# Patient Record
Sex: Male | Born: 1965 | Hispanic: Yes | Marital: Married | Smoking: Never smoker
Health system: Southern US, Community
[De-identification: ages and names within clinical notes are randomized; demographics above are authoritative.]

---

## 2012-05-01 ENCOUNTER — Ambulatory Visit: Payer: Self-pay | Admitting: Family Medicine

## 2012-05-01 VITALS — BP 106/70 | HR 63 | Temp 97.9°F | Resp 16 | Ht 64.5 in | Wt 182.0 lb

## 2012-05-01 DIAGNOSIS — IMO0002 Reserved for concepts with insufficient information to code with codable children: Secondary | ICD-10-CM

## 2012-05-01 DIAGNOSIS — M5416 Radiculopathy, lumbar region: Secondary | ICD-10-CM

## 2012-05-01 DIAGNOSIS — M545 Low back pain, unspecified: Secondary | ICD-10-CM

## 2012-05-01 MED ORDER — OXAPROZIN 600 MG PO TABS: ORAL_TABLET | ORAL | Status: DC

## 2012-05-01 MED ORDER — PREDNISONE 20 MG PO TABS: ORAL_TABLET | ORAL | Status: AC

## 2012-05-01 MED ORDER — CYCLOBENZAPRINE HCL 10 MG PO TABS: ORAL_TABLET | ORAL | Status: DC

## 2012-05-01 NOTE — Patient Instructions (Signed)
Back instruction book  Return in 2 weeks if not better

## 2012-05-01 NOTE — Progress Notes (Signed)
Subjective: 46 year old man who has had an accident and some back pain years ago. 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 He is continued to hurt. It is painful when he lifts things. It is painful when he goes up and down steps. He has not seen a physician for this. He has continued to work. Otherwise he is healthy and not on any regular meds.  Objective: Hispanic male in no acute distress. Alert and oriented. 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 His abdomen is soft. Straight leg raising test is negative. He has lumbar spine looks normal. He's got tenderness over the lower lumbar region, as well as in the right SI joint area. The pain goes down the leg a little bit when he stands. His gait is good. His deep tendon reflexes brisk in the right knee, absent in the left.  Assessment: Low back pain with right radiculopathy  Plan: Prednisone Daypro Try to rest of the best he can Flexeril bedtime

## 2012-05-15 ENCOUNTER — Ambulatory Visit: Payer: Self-pay | Admitting: Family Medicine

## 2012-05-15 ENCOUNTER — Ambulatory Visit: Payer: Self-pay

## 2012-05-15 VITALS — BP 126/78 | HR 94 | Temp 98.0°F | Resp 18 | Ht 64.5 in | Wt 179.4 lb

## 2012-05-15 DIAGNOSIS — M545 Low back pain, unspecified: Secondary | ICD-10-CM

## 2012-05-15 DIAGNOSIS — M533 Sacrococcygeal disorders, not elsewhere classified: Secondary | ICD-10-CM

## 2012-05-15 DIAGNOSIS — IMO0002 Reserved for concepts with insufficient information to code with codable children: Secondary | ICD-10-CM

## 2012-05-15 DIAGNOSIS — M5416 Radiculopathy, lumbar region: Secondary | ICD-10-CM

## 2012-05-15 DIAGNOSIS — K5900 Constipation, unspecified: Secondary | ICD-10-CM

## 2012-05-15 MED ORDER — DICLOFENAC SODIUM 75 MG PO TBEC: 75.0000 mg | DELAYED_RELEASE_TABLET | Freq: Two times a day (BID) | ORAL | Status: AC

## 2012-05-15 MED ORDER — CYCLOBENZAPRINE HCL 10 MG PO TABS: ORAL_TABLET | ORAL | Status: DC

## 2012-05-15 NOTE — Progress Notes (Signed)
Subjective: Patient continues to have pain in his low back, especially on the right side at the SI joint region. He has refrained from working because the pain is intense enough. Says that if the pain was previously ATN, it is now down to about a 6. Even when he is laying down on his stomach will hurt. The pain pills help at bedtime.  Objective: Abdomen soft. Straight leg raising negative. Tenderness in the lower lumbar spine and right sacroiliac region. There does not seem to be a lot of radiculopathy today. Moderate pain on anterior flexion and along the left lateral flexion. Flexing to the right does not seem to bother him. There is some pain on extension. Gait is normal.  Assessment: Low back pain and right sacroiliac pain  Plan: X-ray lumbar spine and proceed from there  [facility] reading (PRIMARY) by  Dr. Alwyn Ren Normal disc spaces and SI joints on plain films.  Lots of bowel contents.  .  Will place him on voltaren twice a day and continue Flexeril at bedtime. Bowels, to loosen his stools and clean him out to help take some of the pressure off the back. Return if worse, otherwise if he is not doing a lot better in a month he is to come back. some MiraLax for

## 2012-05-15 NOTE — Patient Instructions (Addendum)
Get some over-the-counter MiraLax and take one dose daily until stools are loose, then decrease and just take half a dose if necessary to keep bowels moving well.  Take the medications as directed. Return in 1 month if not doing much better.  If you do return to working, please try to be careful in how you lift and pull with your back.

## 2012-05-30 ENCOUNTER — Ambulatory Visit: Payer: Self-pay | Admitting: Family Medicine

## 2012-05-30 VITALS — BP 116/78 | HR 56 | Temp 98.3°F | Resp 14 | Ht 64.5 in | Wt 180.0 lb

## 2012-05-30 DIAGNOSIS — IMO0002 Reserved for concepts with insufficient information to code with codable children: Secondary | ICD-10-CM

## 2012-05-30 DIAGNOSIS — S39012A Strain of muscle, fascia and tendon of lower back, initial encounter: Secondary | ICD-10-CM

## 2012-05-30 MED ORDER — PREDNISONE 20 MG PO TABS: ORAL_TABLET | ORAL | Status: AC

## 2012-05-30 NOTE — Progress Notes (Signed)
[facility] [address], [place] [address] [phone]  Date:  05/30/2012   Name:  Timothy Reese   DOB:  02-Jun-1966   MRN:  [id number]  PCP:  No primary provider on file.    Chief Complaint: Back Pain   History of Present Illness:  Timothy Reese is a 46 y.o. very pleasant male patient who presents with the following:  History of an accident and back injury a few years ago.  He re-injured his back earlier this summer when he lifted something.  He has been treated with prednisone, muscle relaxer and anti- inflammatories.  He took the course of prednisone about one month ago- it did seem to help.  At his follow- up visit he was treated with voltaren and flexeril   Negative x-rays done in August LUMBAR SPINE - 2-3 VIEW  Comparison: None.  Findings: Five lumbar-type vertebral bodies.  Normal lumbar lordosis.  No evidence of fracture or dislocation. Vertebral body heights and  intervertebral disc spaces are maintained.  Visualized bony pelvis appears intact.  Mild degenerative changes of the left hip.  The bilateral sacroiliac joints appear unremarkable.  IMPRESSION:  No acute osseous abnormality is seen.  Clinically significant discrepancy from primary report, if  provided: None  He complains again of back pain just like he had at the beginning of his treatment here.  A week ago he noted numbness in his left leg again- this can come and go.  No weakness in the leg.  He does not have any saddle anesthesia/ groin numbness, and no incont of bowel or bladder.  The numbness/ tingling is in the left lateral thigh area.     Patient Active Problem List  Diagnosis  . Low back pain    No past medical history on file.  No past surgical history on file.  History  Substance Use Topics  . Smoking status: Never Smoker   . Smokeless tobacco: Not on file  . Alcohol Use: Not on file    No family history on file.  No Known Allergies  Medication list has  been reviewed and updated.  Current Outpatient Prescriptions on File Prior to Visit  Medication Sig Dispense Refill  . cyclobenzaprine (FLEXERIL) 10 MG tablet Take one at bedtime for muscle relaxant  30 tablet  1  . diclofenac (VOLTAREN) 75 MG EC tablet Take 1 tablet (75 mg total) by mouth 2 (two) times daily.  60 tablet  1    Review of Systems:  As per HPI- otherwise negative.   Physical Examination: Filed Vitals:   05/30/12 1028  BP: 116/78  Pulse: 56  Temp: 98.3 F (36.8 C)  Resp: 14   Filed Vitals:   05/30/12 1028  Height: 5' 4.5" (1.638 m)  Weight: 180 lb (81.647 kg)   Body mass index is 30.42 kg/(m^2). Ideal Body Weight: Weight in (lb) to have BMI = 25: 147.6   GEN: WDWN, NAD, Non-toxic, A & O x 3 HEENT: Atraumatic, Normocephalic. Neck supple. No masses, No LAD.  PEERL, EOMI, oropharynx wnl Ears and Nose: No external deformity. CV: RRR, No M/G/R. No JVD. No thrill. No extra heart sounds. PULM: CTA B, no wheezes, crackles, rhonchi. No retractions. No resp. distress. No accessory muscle use. EXTR: No c/c/e NEURO Normal gait.  PSYCH: Normally interactive. Conversant. Not depressed or anxious appearing.  Calm demeanor.  Back: he has tenderness over the right SI joint, but some positive LEFT straight leg raise.  Normal strength,  normal patellar DTR.  No sensory deficit  [name] does not speak [place], but has brought along a professional interpreter today.   Assessment and Plan: 1. Back strain  predniSONE (DELTASONE) 20 MG tablet   Suspect that [facility] may have a disc bulge in his lower back.  Explained that this problem may self- resolve, but at some point if his symptoms continue we need to think about an MRI.   Will try another course of prednisone for now, and gave him a letter outlining light duty for his job.  He will let me know if his symptoms persist after one or two weeks more please call me or RTC- sooner if he has any worsening or change of his symptoms   Meds  ordered this encounter  Medications  . predniSONE (DELTASONE) 20 MG tablet    Sig: Take 2 pills for 4 days, than 1 pill for 4 days    Dispense:  12 tablet    Refill:  0     [name], MD

## 2013-05-31 IMAGING — CR DG LUMBAR SPINE 2-3V
2 series · 2 of 2 positions shown · non-contrast
Comparison: None.

CLINICAL DATA: Right lower back pain

LUMBAR SPINE - 2-3 VIEW

[AP]
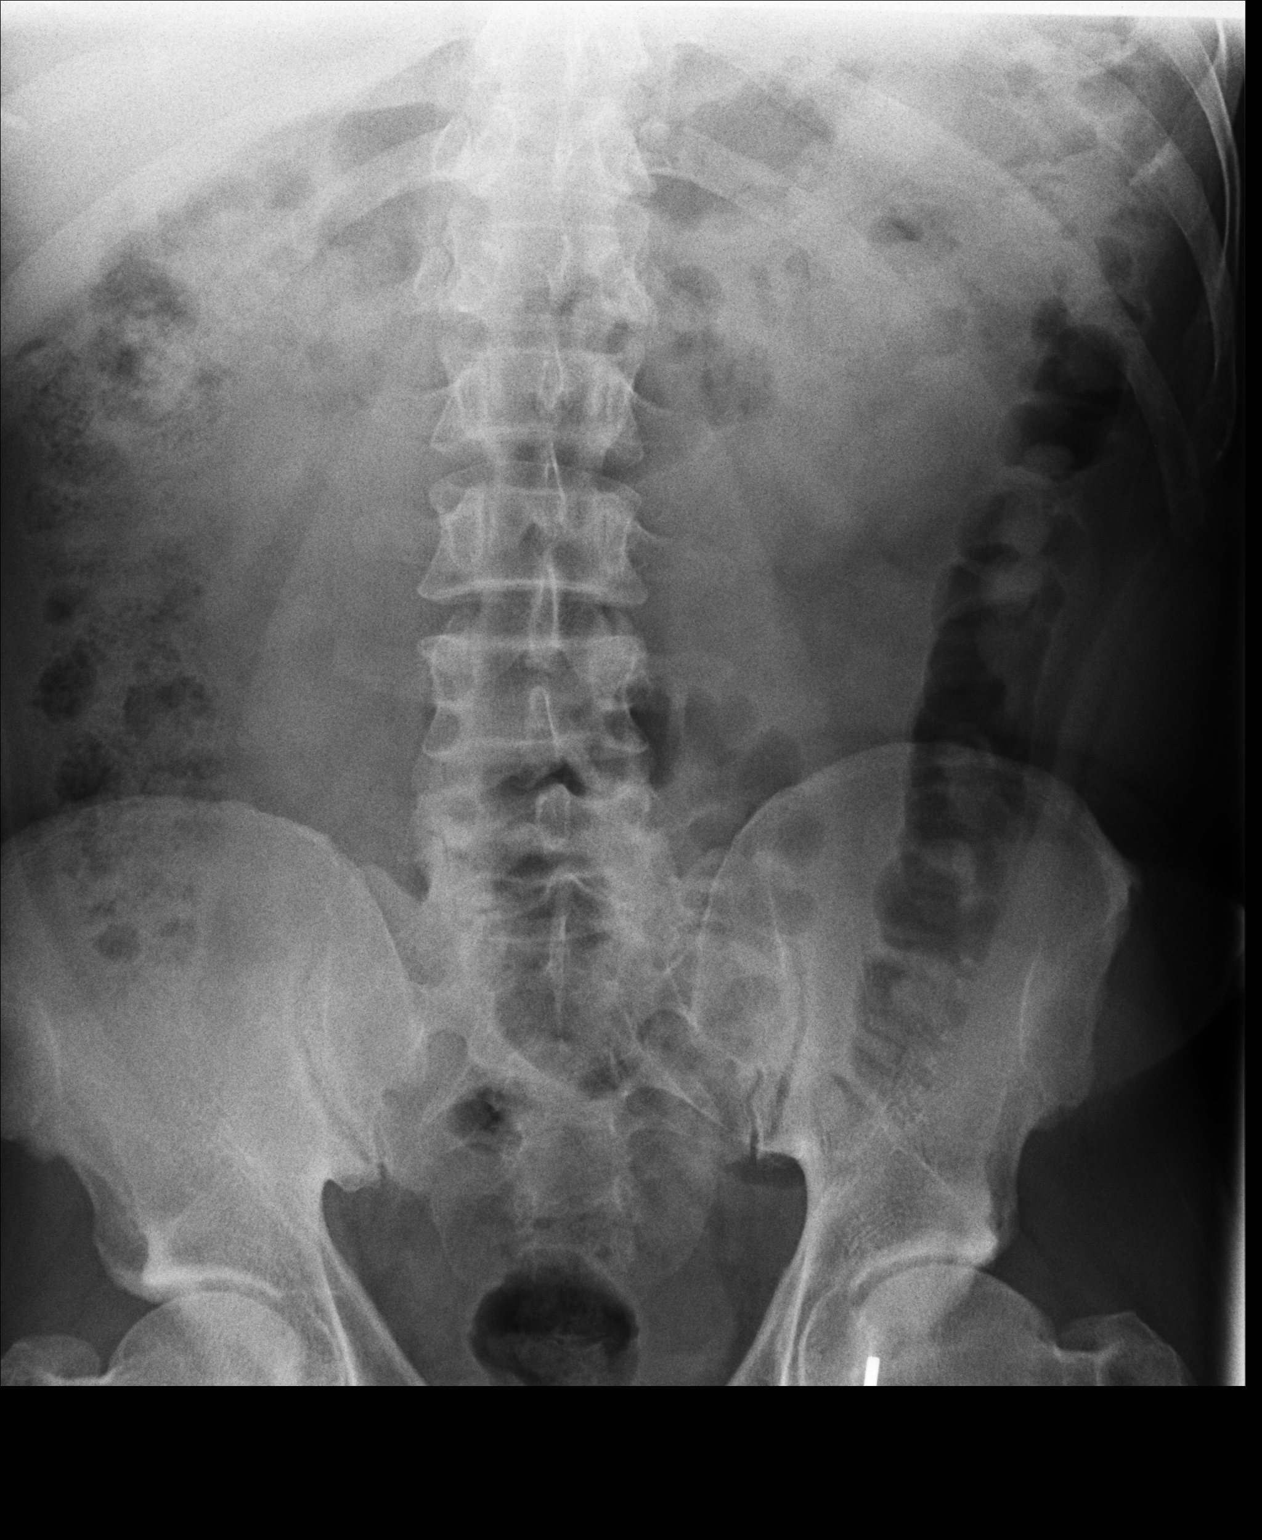

[lateral]
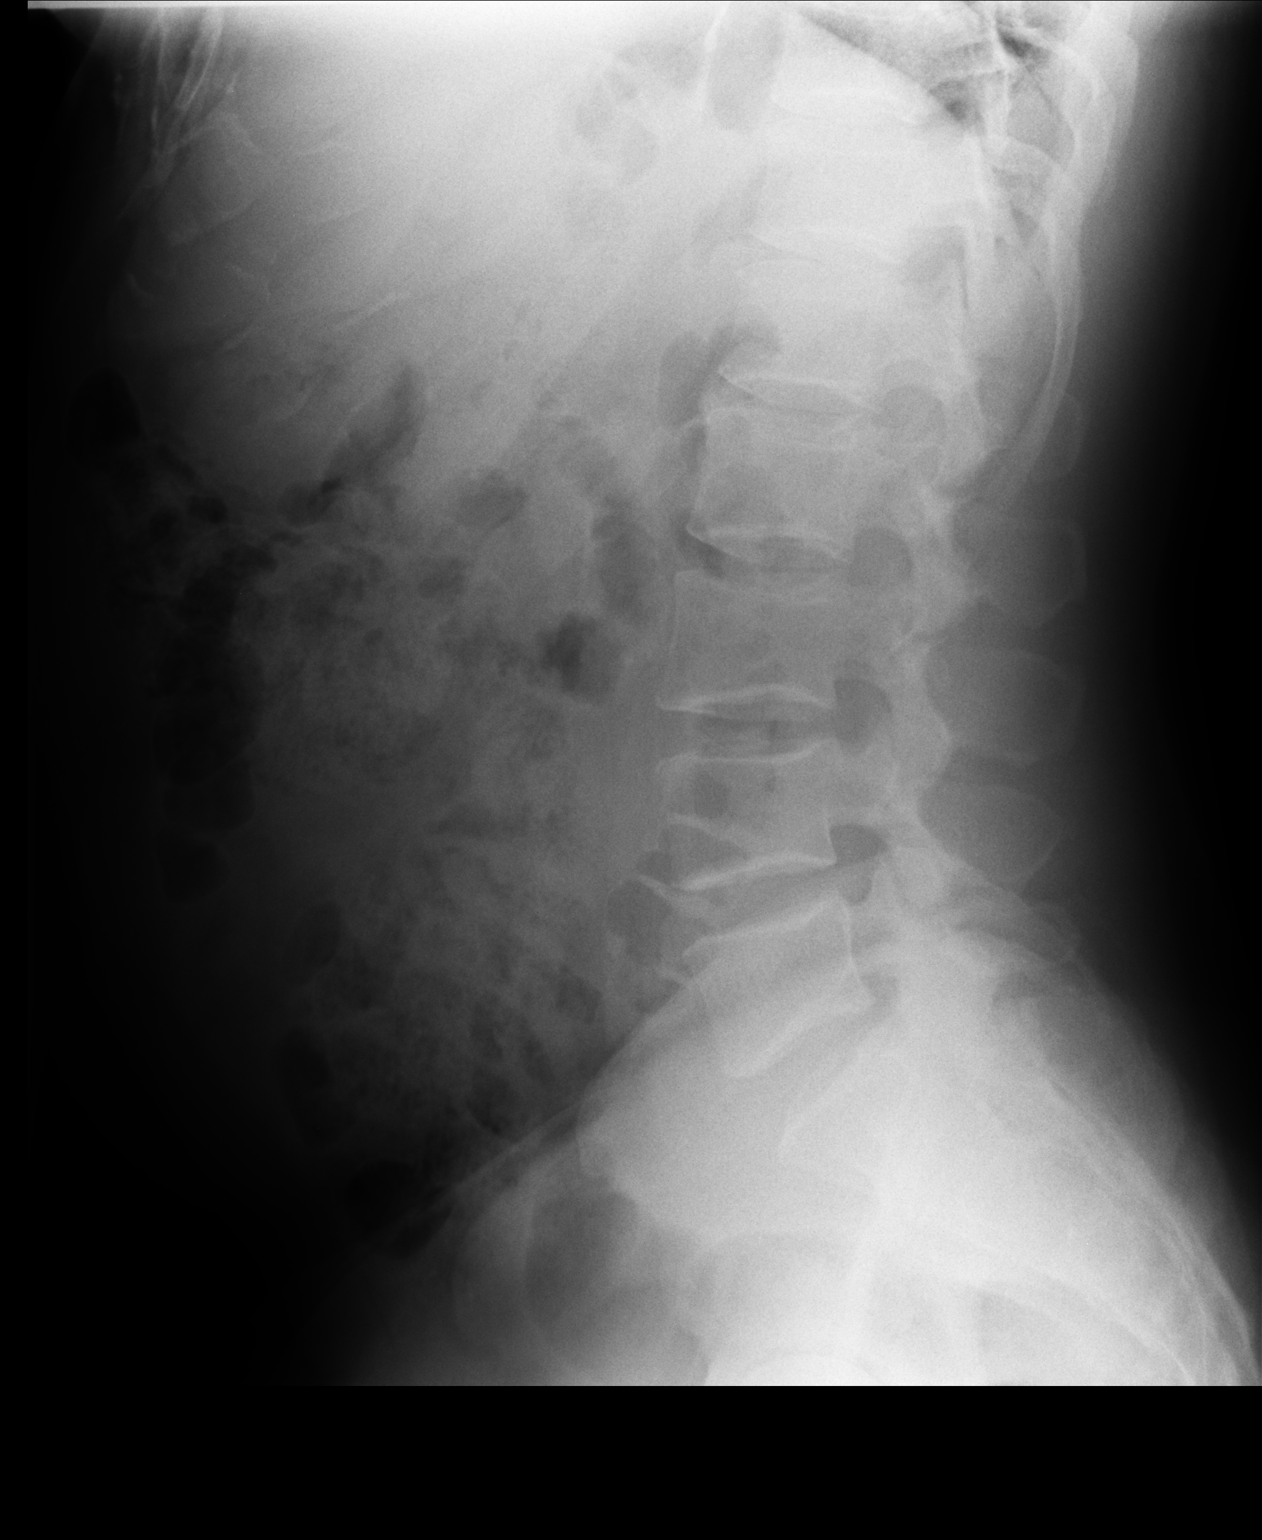

[2 of 2 positions shown; findings below may reference images not displayed]

FINDINGS: Five lumbar-type vertebral bodies.

Normal lumbar lordosis.

No evidence of fracture or dislocation.  Vertebral body heights and
intervertebral disc spaces are maintained.

Visualized bony pelvis appears intact.

Mild degenerative changes of the left hip.

The bilateral sacroiliac joints appear unremarkable.
IMPRESSION: No acute osseous abnormality is seen.

Clinically significant discrepancy from primary report, if
provided: None

## 2015-02-26 ENCOUNTER — Ambulatory Visit (INDEPENDENT_AMBULATORY_CARE_PROVIDER_SITE_OTHER): Payer: Self-pay

## 2015-02-26 ENCOUNTER — Ambulatory Visit (INDEPENDENT_AMBULATORY_CARE_PROVIDER_SITE_OTHER): Payer: Self-pay | Admitting: Family Medicine

## 2015-02-26 VITALS — BP 106/64 | HR 65 | Temp 98.5°F | Resp 18 | Ht 65.0 in | Wt 181.0 lb

## 2015-02-26 DIAGNOSIS — T148XXA Other injury of unspecified body region, initial encounter: Secondary | ICD-10-CM

## 2015-02-26 DIAGNOSIS — M25552 Pain in left hip: Secondary | ICD-10-CM

## 2015-02-26 DIAGNOSIS — T148 Other injury of unspecified body region: Secondary | ICD-10-CM

## 2015-02-26 MED ORDER — CYCLOBENZAPRINE HCL 10 MG PO TABS: ORAL_TABLET | ORAL | Status: DC

## 2015-02-26 MED ORDER — NAPROXEN 500 MG PO TABS: 500.0000 mg | ORAL_TABLET | Freq: Two times a day (BID) | ORAL | Status: DC

## 2015-02-26 NOTE — Progress Notes (Signed)
Chief Complaint:  Chief Complaint  Patient presents with  . Leg Pain    left x2 mths     HPI: Timothy Reese is a 49 y.o. male who is here for  2 month history of left groin and upper thigh pain. He works in [employer]. He denies any injury. He has had this for 2 months and there's been intermittent pain that is localized. He has pain specifically with prolonged walking and also climbing up stairs or ladders. He denies in inguinal hernia. He denies any numbness weakness or issues with his urinary flow or incontinence. No fevers or chills.  History reviewed. No pertinent past medical history. History reviewed. No pertinent past surgical history. History   Social History  . Marital Status: Married    Spouse Name: N/A  . Number of Children: N/A  . Years of Education: N/A   Social History Main Topics  . Smoking status: Never Smoker   . Smokeless tobacco: Not on file  . Alcohol Use: Not on file  . Drug Use: Not on file  . Sexual Activity: Not on file   Other Topics Concern  . None   Social History Narrative   History reviewed. No pertinent family history. No Known Allergies Prior to Admission medications   Medication Sig Start Date End Date Taking? Authorizing Provider  cyclobenzaprine (FLEXERIL) 10 MG tablet Take one at bedtime for muscle relaxant Patient not taking: Reported on 02/26/2015 05/15/12   Peyton Najjaravid H Hopper, MD     ROS: The patient denies fevers, chills, night sweats, unintentional weight loss, chest pain, palpitations, wheezing, dyspnea on exertion, nausea, vomiting, abdominal pain, dysuria, hematuria, melena, numbness, weakness, or tingling.  All other systems have been reviewed and were otherwise negative with the exception of those mentioned in the HPI and as above.    PHYSICAL EXAM: Filed Vitals:   02/26/15 1240  BP: 106/64  Pulse: 65  Temp: 98.5 F (36.9 C)  Resp: 18   Filed Vitals:   02/26/15 1240  Height: 5\' 5"  (1.651 m)  Weight: 181 lb  (82.101 kg)   Body mass index is 30.12 kg/(m^2).   General: Alert, no acute distress HEENT:  Normocephalic, atraumatic, oropharynx patent. EOMI, PERRLA Cardiovascular:  Regular rate and rhythm, no rubs murmurs or gallops.  No Carotid bruits, radial pulse intact. No pedal edema.  Respiratory: Clear to auscultation bilaterally.  No wheezes, rales, or rhonchi.  No cyanosis, no use of accessory musculature GI: No organomegaly, abdomen is soft and non-tender, positive bowel sounds.  No masses. Skin: No rashes. Neurologic: Facial musculature symmetric. Psychiatric: Patient is appropriate throughout our interaction. Lymphatic: No cervical lymphadenopathy Musculoskeletal: Gait intact. Back  - paramsk tenderness  Full ROM 5/5 strength, 2/2 DTRs No saddle anesthesia Straight leg negative  Left  Hip normal exal except pain with flexion and external rotation, 5/5 strength, nl sensationm grind test neg Nontender at great troch  No deformity, Neg ballotment Diffuse tenderness Decrease AROM, full PROM 5/5 strength, 2/2 DTRs ankle ,  Neg Lachman, Neg medial jt line tenderness, neg McMurray L spine normal ROM,  Straight leg negative Hip normal ROM    LABS: No results found for this or any previous visit.   EKG/XRAY:   Primary read interpreted by [facility] Conley RollsLe at [facility]. No fracture or dislocation   ASSESSMENT/PLAN: Encounter Diagnoses  Name Primary?  . Pain in joint, pelvic region and thigh, left Yes  . 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 Patient is aware that all medications have potential sideeffects and we are unable to predict every sideeffect or drug-drug interaction that may occur.  Talal Fritchman PHUONG, DO 02/26/2015 3:50 PM

## 2016-03-13 IMAGING — CR DG PELVIS 1-2V
1 series · 1 of 1 positions shown · non-contrast
Comparison: 05/15/2012

CLINICAL DATA: Left groin and upper thigh pain.

EXAM:
PELVIS - 1-2 VIEW

[AP]
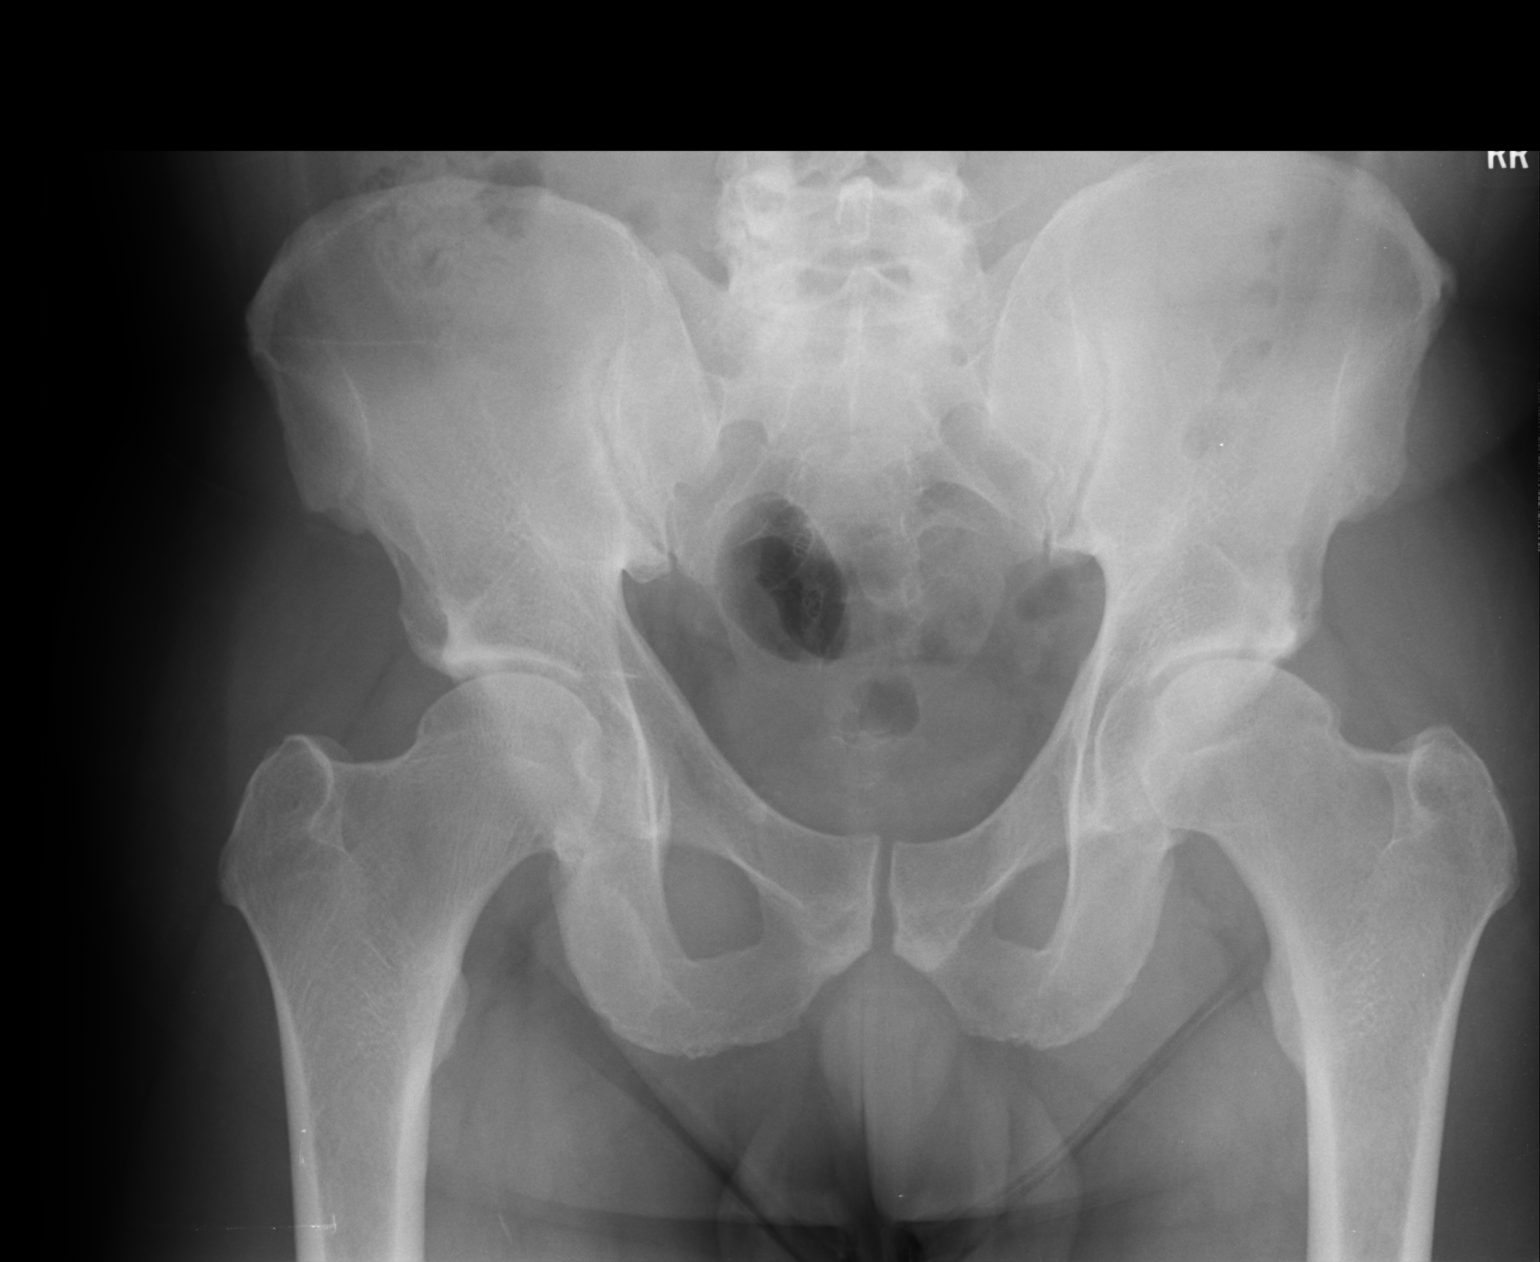

[1 of 1 positions shown; findings below may reference images not displayed]

FINDINGS: Moderate left and mild to moderate right craniocaudad loss of
articular space in the hips. Subcortical sclerosis along the left
hip with spurring along both femoral heads, left greater than right.
This is fairly similar to the 9953 appearance.

Suspected facet arthropathy at L5-S1 bilaterally.
IMPRESSION: 1. No acute findings.
2. Moderate left and mild to moderate right degenerative hip
arthropathy.
3. Degenerative facet arthropathy at L5-S1.

## 2024-07-02 ENCOUNTER — Other Ambulatory Visit: Payer: Self-pay

## 2024-07-02 ENCOUNTER — Encounter: Payer: Self-pay | Admitting: Gastroenterology

## 2024-07-02 ENCOUNTER — Ambulatory Visit: Payer: Self-pay | Admitting: Gastroenterology

## 2024-07-02 VITALS — BP 132/78 | HR 70 | Ht 67.0 in | Wt 185.4 lb

## 2024-07-02 DIAGNOSIS — K760 Fatty (change of) liver, not elsewhere classified: Secondary | ICD-10-CM

## 2024-07-02 DIAGNOSIS — R1011 Right upper quadrant pain: Secondary | ICD-10-CM

## 2024-07-02 DIAGNOSIS — Z1211 Encounter for screening for malignant neoplasm of colon: Secondary | ICD-10-CM

## 2024-07-02 LAB — LIPASE: Lipase: 34 U/L (ref 11.0–59.0)

## 2024-07-02 LAB — COMPREHENSIVE METABOLIC PANEL WITH GFR
ALT: 24 U/L (ref 0–53)
AST: 16 U/L (ref 0–37)
Albumin: 4.6 g/dL (ref 3.5–5.2)
Alkaline Phosphatase: 66 U/L (ref 39–117)
BUN: 10 mg/dL (ref 6–23)
CO2: 27 meq/L (ref 19–32)
Calcium: 9.2 mg/dL (ref 8.4–10.5)
Chloride: 104 meq/L (ref 96–112)
Creatinine, Ser: 0.9 mg/dL (ref 0.40–1.50)
GFR: 93.2 mL/min (ref >60.00)
Glucose, Bld: 91 mg/dL (ref 70–99)
Potassium: 3.8 meq/L (ref 3.5–5.1)
Sodium: 139 meq/L (ref 135–145)
Total Bilirubin: 0.5 mg/dL (ref 0.2–1.2)
Total Protein: 7.9 g/dL (ref 6.0–8.3)

## 2024-07-02 LAB — CBC WITH DIFFERENTIAL/PLATELET
Basophils Absolute: 0 K/uL (ref 0.0–0.1)
Basophils Relative: 0.6 % (ref 0.0–3.0)
Eosinophils Absolute: 0.1 K/uL (ref 0.0–0.7)
Eosinophils Relative: 2.4 % (ref 0.0–5.0)
HCT: 44.4 % (ref 39.0–52.0)
Hemoglobin: 15.3 g/dL (ref 13.0–17.0)
Lymphocytes Relative: 38.5 % (ref 12.0–46.0)
Lymphs Abs: 2 K/uL (ref 0.7–4.0)
MCHC: 34.4 g/dL (ref 30.0–36.0)
MCV: 86.6 fl (ref 78.0–100.0)
Monocytes Absolute: 0.3 K/uL (ref 0.1–1.0)
Monocytes Relative: 5.4 % (ref 3.0–12.0)
Neutro Abs: 2.8 K/uL (ref 1.4–7.7)
Neutrophils Relative %: 53.1 % (ref 43.0–77.0)
Platelets: 176 K/uL (ref 150.0–400.0)
RBC: 5.12 Mil/uL (ref 4.22–5.81)
RDW: 13.3 % (ref 11.5–15.5)
WBC: 5.2 K/uL (ref 4.0–10.5)

## 2024-07-02 MED ORDER — NA SULFATE-K SULFATE-MG SULF 17.5-3.13-1.6 GM/177ML PO SOLN: 1.0000 | Freq: Once | ORAL | 0 refills | Status: AC

## 2024-07-02 NOTE — Patient Instructions (Addendum)
 Your provider has requested that you go to the basement level for lab work before leaving today. Press B on the elevator. The lab is located at the first door on the left as you exit the elevator.  Due to recent changes in healthcare laws, you may see the results of your imaging and laboratory studies on MyChart before your provider has had a chance to review them.  We understand that in some cases there may be results that are confusing or concerning to you. Not all laboratory results come back in the same time frame and the provider may be waiting for multiple results in order to interpret others.  Please give us  48 hours in order for your provider to thoroughly review all the results before contacting the office for clarification of your results.    You have been scheduled for a HIDA scan at [facility] (1st floor) on Monday, 07/15/24. Please arrive 30 minutes prior to your scheduled appointment at  7:00 am. Make certain not to have anything to eat or drink at least 6 hours prior to your test. Should this appointment date or time not work well for you, please call radiology scheduling at [phone].  _____________________________________________________________________ hepatobiliary (HIDA) scan is an imaging procedure used to diagnose problems in the liver, gallbladder and bile ducts. In the HIDA scan, a radioactive chemical or tracer is injected into a vein in your arm. The tracer is handled by the liver like bile. Bile is a fluid produced and excreted by your liver that helps your digestive system break down fats in the foods you eat. Bile is stored in your gallbladder and the gallbladder releases the bile when you eat a meal. A special nuclear medicine scanner (gamma camera) tracks the flow of the tracer from your liver into your gallbladder and small intestine.  During your HIDA scan  You'll be asked to change into a hospital gown before your HIDA scan begins. Your health care team will  position you on a table, usually on your back. The radioactive tracer is then injected into a vein in your arm.The tracer travels through your bloodstream to your liver, where it's taken up by the bile-producing cells. The radioactive tracer travels with the bile from your liver into your gallbladder and through your bile ducts to your small intestine.You may feel some pressure while the radioactive tracer is injected into your vein. As you lie on the table, a special gamma camera is positioned over your abdomen taking pictures of the tracer as it moves through your body. The gamma camera takes pictures continually for about an hour. You'll need to keep still during the HIDA scan. This can become uncomfortable, but you may find that you can lessen the discomfort by taking deep breaths and thinking about other things. Tell your health care team if you're uncomfortable. The radiologist will watch on a computer the progress of the radioactive tracer through your body. The HIDA scan may be stopped when the radioactive tracer is seen in the gallbladder and enters your small intestine. This typically takes about an hour. In some cases extra imaging will be performed if original images aren't satisfactory, if morphine is given to help visualize the gallbladder or if the medication CCK is given to look at the contraction of the gallbladder. This test typically takes 2 hours to complete. ________________________________________________________________________   Rosine have been scheduled for a colonoscopy. Please follow written instructions given to you at your visit today.   If you  use inhalers (even only as needed), please bring them with you on the day of your procedure.  DO NOT TAKE 7 DAYS PRIOR TO TEST- Trulicity (dulaglutide) Ozempic, Wegovy (semaglutide) Mounjaro (tirzepatide) Bydureon Bcise (exanatide extended release)  DO NOT TAKE 1 DAY PRIOR TO YOUR TEST Rybelsus (semaglutide) Adlyxin  (lixisenatide) Victoza (liraglutide) Byetta (exanatide) ___________________________________________________________________________   Thank you for trusting me with your gastrointestinal care!   Camie Furbish, PA-C  _______________________________________________________  If your blood pressure at your visit was 140/90 or greater, please contact your primary care physician to follow up on this.  _______________________________________________________  If you are age 60 or older, your body mass index should be between 23-30. Your Body mass index is 29.04 kg/m. If this is out of the aforementioned range listed, please consider follow up with your Primary Care Provider.  If you are age 75 or younger, your body mass index should be between 19-25. Your Body mass index is 29.04 kg/m. If this is out of the aformentioned range listed, please consider follow up with your Primary Care Provider.   ________________________________________________________  The [facility] providers would like to encourage you to use MYCHART to communicate with providers for non-urgent requests or questions.  Due to long hold times on the telephone, sending your provider a message by [facility] may be a faster and more efficient way to get a response.  Please allow 48 business hours for a response.  Please remember that this is for non-urgent requests.  _______________________________________________________  [facility] is using a team-based approach to care.  Your team is made up of your doctor and two to three APPS. Our APPS (Nurse Practitioners and Physician Assistants) work with your physician to ensure care continuity for you. They are fully qualified to address your health concerns and develop a treatment plan. They communicate directly with your gastroenterologist to care for you. Seeing the Advanced Practice Practitioners on your physician's team can help you by facilitating care more promptly, often  allowing for earlier appointments, access to diagnostic testing, procedures, and other specialty referrals.

## 2024-07-02 NOTE — Progress Notes (Signed)
 Timothy Reese [id number] 10-11-1965   Chief Complaint: Fatty liver  Referring Provider: No ref. provider found Primary GI MD: Unassigned  HPI: Timothy Reese is a 58 y.o. male without significant past medical history who is referred for evaluation of hepatic steatosis.     Labs 05/2024: Normal CBC, normal CMP  RUQ ultrasound 05/2024 showed fatty infiltration of the liver with no evidence of cholelithiasis.   Patient speaks Spanish and an interpreter is present for the visit today.  Patient states he started having intermittent RUQ abdominal pain about a month ago.  Not experiencing pain daily, but was occurring after eating.  Denies associated nausea, vomiting, fever, chills.  Was having pain after eating as well as occasionally in the night.  Denies any heartburn, acid reflux, or dysphagia.  He was advised to avoid red meat, fats, dairy, fried foods, and since making these dietary changes has noticed improvement in pain.  Also started drinking 2 L of water daily.  Does have a follow-up with PCP 10/22.  RUQ ultrasound did not show cholelithiasis but did show fatty liver and he was referred to us  for further evaluation of this.  He does have family history of cirrhosis in his father, who he states drank heavily.  Patient denies alcohol use, smoking, any chronic medical conditions, history of hepatitis.  He is overweight.  No prior colon cancer screening/colonoscopy. Denies family history of colon cancer or stomach cancer.  He has a bowel movement twice daily and denies any diarrhea, constipation, blood in his stool, or melena.  Previous GI Procedures/Imaging      History reviewed. No pertinent past medical history.  History reviewed. No pertinent surgical history.  No current outpatient medications on file.   No current facility-administered medications for this visit.    Allergies as of 07/02/2024   (No Known Allergies)    Family History  Problem Relation Age of  Onset   Uterine cancer Sister    Colon cancer Neg Hx     Social History   Tobacco Use   Smoking status: Never   Smokeless tobacco: Never  Vaping Use   Vaping status: Never Used  Substance Use Topics   Alcohol use: Not Currently   Drug use: Never     Review of Systems:    Constitutional: No unexplained weight loss, fever, chills Cardiovascular: No chest pain Respiratory: No SOB Gastrointestinal: See HPI and otherwise negative Hematologic: No bleeding or bruising   Physical Exam:  Vital signs: BP 132/78   Pulse 70   Ht 5' 7 (1.702 m)   Wt 185 lb 6.4 oz (84.1 kg)   SpO2 97%   BMI 29.04 kg/m   Constitutional: Pleasant, overweight male in NAD, alert and cooperative Head:  Normocephalic and atraumatic.  Eyes: No scleral icterus.  Respiratory: Respirations even and unlabored. Lungs clear to auscultation bilaterally.  No wheezes, crackles, or rhonchi.  Cardiovascular:  Regular rate and rhythm. No murmurs. No peripheral edema. Gastrointestinal:  Soft, nondistended, nontender. No rebound or guarding. Normal bowel sounds. No appreciable masses or hepatomegaly. Rectal:  Not performed.  Neurologic:  Alert and oriented x4;  grossly normal neurologically.  Skin:   Dry and intact without significant lesions or rashes. Psychiatric: Oriented to person, place and time. Demonstrates good judgement and reason without abnormal affect or behaviors.   Assessment/Plan:   Screening for colon cancer Overdue for colon cancer screening.  No prior colonoscopy or other form of screening.  Denies family history of colon cancer.  -  Schedule colonoscopy. I thoroughly discussed the procedure with the patient to include nature of the procedure, alternatives, benefits, and risks (including but not limited to bleeding, infection, perforation, anesthesia/cardiac/pulmonary complications). Patient verbalized understanding and gave verbal consent to proceed with procedure.   Hepatic steatosis RUQ  abdominal pain Patient reports intermittent RUQ abdominal pain which occurs postprandially, ongoing for about a month.  Sometimes has pain at night as well.  No GERD symptoms.  No nausea, vomiting, fever, chills.  Was evaluated by PCP and had RUQ ultrasound which showed fatty liver and no abnormalities of the gallbladder.  Has made some dietary changes including eliminating fried foods, fatty foods, and has noticed some improvement in symptoms.  Nontender on exam today.  Possible biliary colic and will proceed with HIDA scan further evaluation. FIB-4 score based on outside labs is 1.04, advanced fibrosis excluded.    - Labs today: CBC, CMP, HAV, HepBsAg, HepBsAb, HepCAb - Recalculate FIB-4 and consider elastography vs FibroScan if concern for advanced fibrosis - Recommend weight loss, healthy diet with reduction in carbohydrates, exercise, continued avoidance of alcohol - Monitor CBC, CMP every 6 months - Further serologic workup if elevation in liver enzymes - Order HIDA scan for further evaluation of intermittent, postprandial RUQ pain - Follow up after colonoscopy   Camie Furbish, PA-C [facility] 07/02/2024, 8:48 AM  Patient Care Team: Patient, No Pcp Per as PCP - General (General Practice)

## 2024-07-03 ENCOUNTER — Telehealth: Payer: Self-pay | Admitting: Gastroenterology

## 2024-07-03 NOTE — Telephone Encounter (Signed)
 Please let patient know that Dr. Wilhelmenia has reviewed his chart and would like to consider adding an EGD at the same time as his upcoming colonoscopy in further workup of his abdominal pain.  This would allow us  to rule out anything going on in the stomach such as ulcers and take biopsies to check for H. pylori.  Per Dr. Wilhelmenia, it is okay to overbook that date (see addendum to note).  If patient is agreeable, please add on EGD.

## 2024-07-03 NOTE — Progress Notes (Signed)
 Attending Physician's Attestation   I have reviewed the chart.   I agree with the Advanced Practitioner's note, impression, and recommendations with any updates as below. I think based on his abdominal discomfort and negative ultrasound, as we worked through potential gallbladder issues, it makes sense to consider H. pylori and ulcer disease.  If patient is amenable, upper endoscopy seems like a reasonable additive process to his currently scheduled colonoscopy.  If he is in agreement, then lets do EGD at same time as colonoscopy.  Okay to overbook that date.  Will have my team work on reaching out to patient and seeing if he would be amenable to this.   Aloha Finner, MD [facility] Advanced Endoscopy Office # [phone]

## 2024-07-04 LAB — HEPATITIS B SURFACE ANTIBODY,QUALITATIVE: Hep B S Ab: NONREACTIVE

## 2024-07-04 LAB — HEPATITIS B SURFACE ANTIGEN: Hepatitis B Surface Ag: NONREACTIVE

## 2024-07-04 LAB — HEPATITIS C ANTIBODY: Hepatitis C Ab: NONREACTIVE

## 2024-07-04 LAB — HEPATITIS A ANTIBODY, TOTAL: Hepatitis A AB,Total: REACTIVE — AB

## 2024-07-04 NOTE — Telephone Encounter (Signed)
 Using interpreter, left a message for the patient to return our call. Not all the labs are back yet. Still waiting for results on the hepatitis studies.

## 2024-07-05 NOTE — Progress Notes (Signed)
 This is the patient that needs overbooking.

## 2024-07-09 ENCOUNTER — Encounter: Payer: Self-pay | Admitting: Gastroenterology

## 2024-07-09 ENCOUNTER — Ambulatory Visit: Payer: Self-pay | Admitting: Gastroenterology

## 2024-07-09 VITALS — BP 131/71 | HR 63 | Temp 98.2°F | Resp 12 | Ht 67.0 in | Wt 185.0 lb

## 2024-07-09 DIAGNOSIS — Z1211 Encounter for screening for malignant neoplasm of colon: Secondary | ICD-10-CM

## 2024-07-09 DIAGNOSIS — K2970 Gastritis, unspecified, without bleeding: Secondary | ICD-10-CM

## 2024-07-09 DIAGNOSIS — D125 Benign neoplasm of sigmoid colon: Secondary | ICD-10-CM

## 2024-07-09 DIAGNOSIS — R1011 Right upper quadrant pain: Secondary | ICD-10-CM

## 2024-07-09 DIAGNOSIS — K2289 Other specified disease of esophagus: Secondary | ICD-10-CM

## 2024-07-09 DIAGNOSIS — K5730 Diverticulosis of large intestine without perforation or abscess without bleeding: Secondary | ICD-10-CM

## 2024-07-09 DIAGNOSIS — K449 Diaphragmatic hernia without obstruction or gangrene: Secondary | ICD-10-CM

## 2024-07-09 DIAGNOSIS — B9681 Helicobacter pylori [H. pylori] as the cause of diseases classified elsewhere: Secondary | ICD-10-CM

## 2024-07-09 DIAGNOSIS — K641 Second degree hemorrhoids: Secondary | ICD-10-CM

## 2024-07-09 DIAGNOSIS — D127 Benign neoplasm of rectosigmoid junction: Secondary | ICD-10-CM

## 2024-07-09 DIAGNOSIS — K649 Unspecified hemorrhoids: Secondary | ICD-10-CM

## 2024-07-09 DIAGNOSIS — K229 Disease of esophagus, unspecified: Secondary | ICD-10-CM

## 2024-07-09 DIAGNOSIS — K2980 Duodenitis without bleeding: Secondary | ICD-10-CM

## 2024-07-09 MED ORDER — OMEPRAZOLE 40 MG PO CPDR: 40.0000 mg | DELAYED_RELEASE_CAPSULE | Freq: Two times a day (BID) | ORAL | 3 refills | Status: AC

## 2024-07-09 MED ORDER — SODIUM CHLORIDE 0.9 % IV SOLN: 500.0000 mL | Freq: Once | INTRAVENOUS | Status: DC

## 2024-07-09 NOTE — Op Note (Signed)
 [facility] Patient Name: Timothy Reese Procedure Date: 07/09/2024 10:52 AM MRN: [id number] Endoscopist: Aloha Finner , MD, [id number] Age: 58 Referring MD:  Date of Birth: April 03, 1966 Gender: Male Account #: [id number] Procedure:                Upper GI endoscopy Indications:              Epigastric abdominal pain, Abdominal pain in the                            right upper quadrant Medicines:                Monitored Anesthesia Care Procedure:                Pre-Anesthesia Assessment:                           - Prior to the procedure, a History and Physical                            was performed, and patient medications and                            allergies were reviewed. The patient's tolerance of                            previous anesthesia was also reviewed. The risks                            and benefits of the procedure and the sedation                            options and risks were discussed with the patient.                            All questions were answered, and informed consent                            was obtained. Prior Anticoagulants: The patient has                            taken no anticoagulant or antiplatelet agents. ASA                            Grade Assessment: II - A patient with mild systemic                            disease. After reviewing the risks and benefits,                            the patient was deemed in satisfactory condition to                            undergo the procedure.  After obtaining informed consent, the endoscope was                            passed under direct vision. Throughout the                            procedure, the patient's blood pressure, pulse, and                            oxygen saturations were monitored continuously. The                            GIF [id number] #[id number] was introduced through the                            mouth, and advanced to the  second part of duodenum.                            The upper GI endoscopy was accomplished without                            difficulty. The patient tolerated the procedure. Scope In: Scope Out: Findings:                 No gross lesions were noted in the entire esophagus.                           The Z-line was irregular and was found 36 cm from                            the incisors.                           A 2 cm hiatal hernia was present.                           Patchy moderately erythematous mucosa without                            bleeding was found in the entire examined stomach.                            Biopsies were taken with a cold forceps for                            histology and Helicobacter pylori testing.                           Localized moderate inflammation characterized by                            erosions, erythema, friability and granularity was                            found in the  duodenal bulb.                           No other gross lesions were noted in the duodenal                            sweep and in the second portion of the duodenum.                            Biopsies were taken with a cold forceps for                            histology and Helicobacter pylori testing. Complications:            No immediate complications. Estimated Blood Loss:     Estimated blood loss was minimal. Impression:               - No gross lesions in the entire esophagus. Z-line                            irregular, 36 cm from the incisors.                           - 2 cm hiatal hernia.                           - Erythematous mucosa in the stomach. Biopsied.                           - Duodenitis in the bulb. No other gross lesions in                            the duodenal sweep and in the second portion of the                            duodenum. Biopsied. Recommendation:           - Proceed to scheduled colonoscopy.                           -  Initiate Omeprazole 40 mg twice daily for 2                            months and then once daily.                           - Continue present medications.                           - Await pathology results.                           - The findings and recommendations were discussed                            with the patient.                           -  The findings and recommendations were discussed                            with the patient's family. Aloha Finner, MD 07/09/2024 11:30:47 AM

## 2024-07-09 NOTE — Op Note (Signed)
 [facility] Patient Name: Timothy Reese Procedure Date: 07/09/2024 10:48 AM MRN: [id number] Endoscopist: Aloha Finner , MD, [id number] Age: 58 Referring MD:  Date of Birth: 06/15/1966 Gender: Male Account #: [id number] Procedure:                Colonoscopy Indications:              Screening for colorectal malignant neoplasm Medicines:                Monitored Anesthesia Care Procedure:                Pre-Anesthesia Assessment:                           - Prior to the procedure, a History and Physical                            was performed, and patient medications and                            allergies were reviewed. The patient's tolerance of                            previous anesthesia was also reviewed. The risks                            and benefits of the procedure and the sedation                            options and risks were discussed with the patient.                            All questions were answered, and informed consent                            was obtained. Prior Anticoagulants: The patient has                            taken no anticoagulant or antiplatelet agents. ASA                            Grade Assessment: II - A patient with mild systemic                            disease. After reviewing the risks and benefits,                            the patient was deemed in satisfactory condition to                            undergo the procedure.                           After obtaining informed consent, the colonoscope  was passed under direct vision. Throughout the                            procedure, the patient's blood pressure, pulse, and                            oxygen saturations were monitored continuously. The                            CF [id number] #[id number] was introduced through the anus                            and advanced to the the cecum, identified by                            appendiceal  orifice and ileocecal valve. The                            colonoscopy was performed without difficulty. The                            patient tolerated the procedure. The quality of the                            bowel preparation was adequate. The ileocecal                            valve, appendiceal orifice, and rectum were                            photographed. Scope In: 11:10:04 AM Scope Out: 11:25:17 AM Scope Withdrawal Time: 0 hours 11 minutes 36 seconds  Total Procedure Duration: 0 hours 15 minutes 13 seconds  Findings:                 The digital rectal exam findings include                            hemorrhoids. Pertinent negatives include no                            palpable rectal lesions.                           A large amount of semi-liquid stool was found in                            the entire colon, interfering with visualization.                            Lavage of the area was performed using copious                            amounts, resulting in clearance with adequate  visualization.                           Multiple small-mouthed diverticula were found in                            the entire colon.                           Two sessile polyps were found in the recto-sigmoid                            colon and sigmoid colon. The polyps were 3 to 5 mm                            in size. These polyps were removed with a cold                            snare. Resection and retrieval were complete.                           Non-bleeding non-thrombosed internal hemorrhoids                            were found during retroflexion, during perianal                            exam and during digital exam. The hemorrhoids were                            Grade II (internal hemorrhoids that prolapse but                            reduce spontaneously). Complications:            No immediate complications. Estimated Blood Loss:      Estimated blood loss was minimal. Impression:               - Hemorrhoids found on digital rectal exam.                           - Stool in the entire examined colon. Lavaged with                            adequate visualization.                           - Diverticulosis in the entire examined colon.                           - Two 3 to 5 mm polyps at the recto-sigmoid colon                            and in the sigmoid colon, removed with a cold  snare. Resected and retrieved.                           - Non-bleeding non-thrombosed internal hemorrhoids. Recommendation:           - The patient will be observed post-procedure,                            until all discharge criteria are met.                           - Discharge patient to home.                           - Patient has a contact number available for                            emergencies. The signs and symptoms of potential                            delayed complications were discussed with the                            patient. Return to normal activities tomorrow.                            Written discharge instructions were provided to the                            patient.                           - High fiber diet.                           - Use FiberCon 1-2 tablets PO daily.                           - Continue present medications.                           - Await pathology results.                           - Repeat colonoscopy in 5-10 years for surveillance                            based on pathology results.                           - The findings and recommendations were discussed                            with the patient.                           - The findings and recommendations were discussed  with the patient's family. Aloha Finner, MD 07/09/2024 11:33:46 AM

## 2024-07-09 NOTE — Progress Notes (Signed)
 Called to room to assist during endoscopic procedure.  Patient ID and intended procedure confirmed with present staff. Received instructions for my participation in the procedure from the performing physician.

## 2024-07-09 NOTE — Progress Notes (Signed)
   GASTROENTEROLOGY PROCEDURE H&P NOTE   Primary Care Physician: Patient, No Pcp Per  HPI: Timothy Reese is a 58 y.o. male who presents for Colonoscopy +/- EGD for colon cancer screening and upper abdominal pain evaluation.  No past medical history on file. No past surgical history on file. No current outpatient medications on file.   No current facility-administered medications for this visit.   No current outpatient medications on file. No Known Allergies Family History  Problem Relation Age of Onset   Uterine cancer Sister    Colon cancer Neg Hx    Social History   Socioeconomic History   Marital status: Married    Spouse name: Not on file   Number of children: Not on file   Years of education: Not on file   Highest education level: Not on file  Occupational History   Not on file  Tobacco Use   Smoking status: Never   Smokeless tobacco: Never  Vaping Use   Vaping status: Never Used  Substance and Sexual Activity   Alcohol use: Not Currently   Drug use: Never   Sexual activity: Not on file  Other Topics Concern   Not on file  Social History Narrative   Not on file   Social Drivers of Health   Financial Resource Strain: Not on file  Food Insecurity: Not on file  Transportation Needs: Not on file  Physical Activity: Not on file  Stress: Not on file  Social Connections: Not on file  Intimate Partner Violence: Not on file    Physical Exam: There were no vitals filed for this visit. There is no height or weight on file to calculate BMI. GEN: NAD EYE: Sclerae anicteric ENT: MMM CV: Non-tachycardic GI: Soft, NT/ND NEURO:  Alert & Oriented x 3  Lab Results: No results for input(s): WBC, HGB, HCT, PLT in the last 72 hours. BMET No results for input(s): NA, K, CL, CO2, GLUCOSE, BUN, CREATININE, CALCIUM in the last 72 hours. LFT No results for input(s): PROT, ALBUMIN, AST, ALT, ALKPHOS, BILITOT, BILIDIR, IBILI in  the last 72 hours. PT/INR No results for input(s): LABPROT, INR in the last 72 hours.   Impression / Plan: This is a 58 y.o.male who presents for Colonoscopy +/- EGD for colon cancer screening and upper abdominal pain evaluation.  The risks and benefits of endoscopic evaluation/treatment were discussed with the patient and/or family; these include but are not limited to the risk of perforation, infection, bleeding, missed lesions, lack of diagnosis, severe illness requiring hospitalization, as well as anesthesia and sedation related illnesses.  The patient's history has been reviewed, patient examined, no change in status, and deemed stable for procedure.  The patient and/or family is agreeable to proceed.    Aloha Finner, MD [facility] Advanced Endoscopy Office # [phone]

## 2024-07-09 NOTE — Patient Instructions (Signed)
 USTED TUVO UN PROCEDIMIENTO ENDOSCPICO HOY EN EL [facility]:   Lea el informe del procedimiento que se le entreg para cualquier pregunta especfica sobre lo que se Dentist.  Si el informe del examen no responde a sus preguntas, por favor llame a su gastroenterlogo para aclararlo.  Si usted solicit que no se le den [employer] de lo que se Clinical cytogeneticist en su procedimiento al [employer] va a cuidar, entonces el informe del procedimiento se ha incluido en un sobre sellado para que usted lo revise despus cuando le sea ms conveniente.   LO QUE PUEDE ESPERAR: Algunas sensaciones de hinchazn en el abdomen.  Puede tener ms gases de lo normal.  El caminar puede ayudarle a eliminar el aire que se le puso en el tracto gastrointestinal durante el procedimiento y reducir la hinchazn.  Si le hicieron una endoscopia inferior (como una colonoscopia o una sigmoidoscopia flexible), podra notar manchas de sangre en las heces fecales o en el papel higinico.  Si se someti a una preparacin intestinal para su procedimiento, es posible que no tenga una evacuacin intestinal normal durante [employer].   Tenga en cuenta:  Es posible que note un poco de irritacin y congestin en la nariz o algn drenaje.  Esto es debido al oxgeno [employer] durante su procedimiento.  No hay que preocuparse y esto debe desaparecer ms o Regulatory affairs officer.   SNTOMAS PARA REPORTAR INMEDIATAMENTE:  Despus de una endoscopia inferior (colonoscopia o sigmoidoscopia flexible):  Cantidades excesivas de sangre en las heces fecales  Sensibilidad significativa o empeoramiento de los dolores abdominales   Hinchazn aguda del abdomen que antes no tena   Fiebre de 100F o ms   Despus de la endoscopia superior (EGD)  Vmitos de Retail buyer o material como caf molido   Dolor en el pecho o dolor debajo de los omplatos que antes no tena   Dolor o dificultad persistente para tragar  Falta de aire que antes no  tena   Fiebre de 100F o ms  Heces fecales negras y pegajosas   Para asuntos urgentes o de Associate Professor, puede comunicarse con un gastroenterlogo a cualquier hora llamando al [phone].  DIETA:  Recomendamos una comida pequea al principio, pero luego puede continuar con su dieta normal.  Tome muchos lquidos, pero debe evitar las bebidas alcohlicas durante 24 horas.    ACTIVIDAD:  Debe planear tomarse las cosas con calma por el resto del da y no debe CONDUCIR ni usar maquinaria pesada Patent examiner (debido a los medicamentos de sedacin utilizados durante el examen).     SEGUIMIENTO: Nuestro personal llamar al nmero que aparece en su historial al siguiente da hbil de su procedimiento para ver cmo se siente y para responder cualquier pregunta o inquietud que pueda tener con respecto a la informacin que se le dio despus del procedimiento. Si no podemos contactarle, le dejaremos un mensaje.  Sin embargo, si se siente bien y no tiene English as a second language teacher, no es necesario que nos devuelva la llamada.  Asumiremos que ha regresado a sus actividades diarias normales sin incidentes. Si se le tomaron algunas biopsias, le contactaremos por telfono o por carta en las prximas 3 semanas.  Si no ha sabido [employer] biopsias en el transcurso de 3 semanas, por favor llmenos al [phone].   FIRMAS/CONFIDENCIALIDAD: Usted y/o el acompaante que le cuide han firmado documentos que se ingresarn en su historial mdico electrnico.  Estas firmas atestiguan el hecho  de que la informacin anterior   Resume previous diet and medications. Start Omeprazole 40 mg twice daily for 2 months and then continue taking daily. Follow a high fiber diet. Take Fibercon 1-2 tablets by mouth daily. Repeat Colonoscopy in 5-10 years based on pathology results.  YOU HAD AN ENDOSCOPIC PROCEDURE TODAY AT THE [facility]:   Refer to the procedure report that was given to you for any specific questions about  what was found during the examination.  If the procedure report does not answer your questions, please call your gastroenterologist to clarify.  If you requested that your care partner not be given the details of your procedure findings, then the procedure report has been included in a sealed envelope for you to review at your convenience later.  YOU SHOULD EXPECT: Some feelings of bloating in the abdomen. Passage of more gas than usual.  Walking can help get rid of the air that was put into your GI tract during the procedure and reduce the bloating. If you had a lower endoscopy (such as a colonoscopy or flexible sigmoidoscopy) you may notice spotting of blood in your stool or on the toilet paper. If you underwent a bowel prep for your procedure, you may not have a normal bowel movement for a few days.  Please Note:  You might notice some irritation and congestion in your nose or some drainage.  This is from the oxygen used during your procedure.  There is no need for concern and it should clear up in a day or so.  SYMPTOMS TO REPORT IMMEDIATELY:  Following lower endoscopy (colonoscopy or flexible sigmoidoscopy):  Excessive amounts of blood in the stool  Significant tenderness or worsening of abdominal pains  Swelling of the abdomen that is new, acute  Fever of 100F or higher  Following upper endoscopy (EGD)  Vomiting of blood or coffee ground material  New chest pain or pain under the shoulder blades  Painful or persistently difficult swallowing  New shortness of breath  Fever of 100F or higher  Black, tarry-looking stools  For urgent or emergent issues, a gastroenterologist can be reached at any hour by calling [phone]. Do not use MyChart messaging for urgent concerns.    DIET:  We do recommend a small meal at first, but then you may proceed to your regular diet.  Drink plenty of fluids but you should avoid alcoholic beverages for 24 hours.  ACTIVITY:  You should plan to take  it easy for the rest of today and you should NOT DRIVE or use heavy machinery until tomorrow (because of the sedation medicines used during the test).    FOLLOW UP: Our staff will call the number listed on your records the next business day following your procedure.  We will call around 7:15- 8:00 am to check on you and address any questions or concerns that you may have regarding the information given to you following your procedure. If we do not reach you, we will leave a message.     If any biopsies were taken you will be contacted by phone or by letter within the next 1-3 weeks.  Please call us  at [phone] if you have not heard about the biopsies in 3 weeks.    SIGNATURES/CONFIDENTIALITY: You and/or your care partner have signed paperwork which will be entered into your electronic medical record.  These signatures attest to the fact that that the information above on your After Visit Summary has been reviewed and  is understood.  Full responsibility of the confidentiality of this discharge information lies with you and/or your care-partner.

## 2024-07-09 NOTE — Progress Notes (Signed)
 Koren, Spanish interpreter, used for admission process.  Allowed time for questions.  Patient verbalized understanding of the procedure and the cost estimate since he is self pay.  Patient desires to proceed with the endoscopy and the colonoscopy.

## 2024-07-09 NOTE — Progress Notes (Signed)
 To pacu, VSS. Report to [email or web address]

## 2024-07-10 ENCOUNTER — Telehealth: Payer: Self-pay

## 2024-07-10 ENCOUNTER — Ambulatory Visit (HOSPITAL_COMMUNITY): Payer: Self-pay

## 2024-07-10 NOTE — Telephone Encounter (Signed)
 No answer on follow up call - voice mail message left.  Spanish Interpreter # [phone]

## 2024-07-11 ENCOUNTER — Ambulatory Visit: Payer: Self-pay | Admitting: Gastroenterology

## 2024-07-11 LAB — SURGICAL PATHOLOGY

## 2024-07-12 ENCOUNTER — Other Ambulatory Visit: Payer: Self-pay

## 2024-07-12 DIAGNOSIS — A048 Other specified bacterial intestinal infections: Secondary | ICD-10-CM

## 2024-07-12 MED ORDER — METRONIDAZOLE 250 MG PO TABS: 250.0000 mg | ORAL_TABLET | Freq: Four times a day (QID) | ORAL | 0 refills | Status: AC

## 2024-07-12 MED ORDER — TETRACYCLINE HCL 500 MG PO CAPS: 500.0000 mg | ORAL_CAPSULE | Freq: Four times a day (QID) | ORAL | 0 refills | Status: AC

## 2024-07-12 MED ORDER — BISMUTH SUBSALICYLATE 262 MG PO TABS: 2.0000 | ORAL_TABLET | Freq: Four times a day (QID) | ORAL | 0 refills | Status: AC

## 2024-07-15 ENCOUNTER — Ambulatory Visit (HOSPITAL_COMMUNITY): Payer: Self-pay

## 2024-08-20 ENCOUNTER — Ambulatory Visit: Payer: Self-pay | Admitting: Gastroenterology

## 2024-08-20 NOTE — Progress Notes (Deleted)
 Timothy Reese [id number] October 08, 1965   Chief Complaint:  Referring Provider: No ref. provider found Primary GI MD: Dr. Wilhelmenia  HPI: Timothy Reese is a 58 y.o. male with past medical history of *** who presents today for a complaint of *** .    Labs 05/2024: Normal CBC, normal CMP   RUQ ultrasound 05/2024 showed fatty infiltration of the liver with no evidence of cholelithiasis.  Initially seen in office 07/02/2024 having been referred for evaluation of hepatic steatosis.  Had a low risk fib 4 score.  Endorsed intermittent RUQ abdominal pain postprandially, ongoing for about a month.  No GERD symptoms.  Previous RUQ ultrasound showed no abnormalities of the gallbladder.  Had made some dietary changes including limiting fried foods, fatty foods, and noticed improvement in symptoms.  HIDA scan was ordered.  Labs ordered to rule out chronic hepatitis with plan for further serologic workup if liver enzymes remain elevated.  Was also scheduled for initial screening colonoscopy at that time.  Labs returned normal, no elevation in liver enzymes.  Dr. Wilhelmenia advised adding upper endoscopy onto colonoscopy for further evaluation of symptoms.  Underwent EGD/colonoscopy 07/09/2024.  Found to have stool in the entire examined colon, hemorrhoids, 2 adenomatous polyps removed, recommended recall 7 years.  On EGD found to have a 2 cm hiatal hernia, erythematous mucosa in the stomach which on biopsy came back positive for H. pylori.  Was treated for H. pylori per protocol, will need stool eradication study.  Order has been placed.  Has not had HIDA scan.   Discussed the use of AI scribe software for clinical note transcription with the patient, who gave verbal consent to proceed.  History of Present Illness       Previous GI Procedures/Imaging   Colonoscopy 07/09/2024 - Hemorrhoids found on digital rectal exam.  - Stool in the entire examined colon. Lavaged with adequate  visualization.  - Diverticulosis in the entire examined colon.  - Two 3 to 5 mm polyps at the recto- sigmoid colon and in the sigmoid colon, removed with a cold snare. Resected and retrieved.  - Non- bleeding non- thrombosed internal hemorrhoids. - Recall 7 years  EGD 07/09/2024 - No gross lesions in the entire esophagus. Z- line irregular, 36 cm from the incisors.  - 2 cm hiatal hernia.  - Erythematous mucosa in the stomach. Biopsied.  - Duodenitis in the bulb. No other gross lesions in the duodenal sweep and in the second portion of the duodenum. Biopsied.  Path: 1. Surgical [P], gastric :       - H.PYLORI GASTRITIS       - POSITIVE FOR H. PYLORI ON H&E STAIN       - NEGATIVE FOR INTESTINAL METAPLASIA, DYSPLASIA AND MALIGNANCY        2. Surgical [P], duodenal :       - PEPTIC DUODENITIS        3. Surgical [P], colon, recto-sigmoid and sigmoid, polyp (2) :       - TUBULAR ADENOMA(S).       - NO HIGH GRADE DYSPLASIA OR MALIGNANCY.    No past medical history on file.  No past surgical history on file.  Current Outpatient Medications  Medication Sig Dispense Refill   omeprazole  (PRILOSEC) 40 MG capsule Take 1 capsule (40 mg total) by mouth in the morning and at bedtime. 180 capsule 3   No current facility-administered medications for this visit.    Allergies as of 08/20/2024   (No Known  Allergies)    Family History  Problem Relation Age of Onset   Uterine cancer Sister    Colon cancer Neg Hx     Social History   Tobacco Use   Smoking status: Never   Smokeless tobacco: Never  Vaping Use   Vaping status: Never Used  Substance Use Topics   Alcohol use: Not Currently   Drug use: Never     Review of Systems:    Constitutional: No weight loss, fever, chills, weakness or fatigue Eyes: No change in vision Ears, Nose, Throat:  No change in hearing or congestion Skin: No rash or itching Cardiovascular: No chest pain, chest pressure or palpitations   Respiratory:  No SOB or cough Gastrointestinal: See HPI and otherwise negative Genitourinary: No dysuria or change in urinary frequency Neurological: No headache, dizziness or syncope Musculoskeletal: No new muscle or joint pain Hematologic: No bleeding or bruising    Physical Exam:  Vital signs: There were no vitals taken for this visit.  Constitutional: NAD, Well developed, Well nourished, alert and cooperative Head:  Normocephalic and atraumatic.  Eyes: No scleral icterus. Conjunctiva pink. Mouth: No oral lesions. Respiratory: Respirations even and unlabored. Lungs clear to auscultation bilaterally.  No wheezes, crackles, or rhonchi.  Cardiovascular:  Regular rate and rhythm. No murmurs. No peripheral edema. Gastrointestinal:  Soft, nondistended, nontender. No rebound or guarding. Normal bowel sounds. No appreciable masses or hepatomegaly. Rectal:  Not performed.  Neurologic:  Alert and oriented x4;  grossly normal neurologically.  Skin:   Dry and intact without significant lesions or rashes. Psychiatric: Oriented to person, place and time. Demonstrates good judgement and reason without abnormal affect or behaviors.   RELEVANT LABS AND IMAGING: CBC    Component Value Date/Time   WBC 5.2 07/02/2024 0948   RBC 5.12 07/02/2024 0948   HGB 15.3 07/02/2024 0948   HCT 44.4 07/02/2024 0948   PLT 176.0 07/02/2024 0948   MCV 86.6 07/02/2024 0948   MCHC 34.4 07/02/2024 0948   RDW 13.3 07/02/2024 0948   LYMPHSABS 2.0 07/02/2024 0948   MONOABS 0.3 07/02/2024 0948   EOSABS 0.1 07/02/2024 0948   BASOSABS 0.0 07/02/2024 0948    CMP     Component Value Date/Time   NA 139 07/02/2024 0948   K 3.8 07/02/2024 0948   CL 104 07/02/2024 0948   CO2 27 07/02/2024 0948   GLUCOSE 91 07/02/2024 0948   BUN 10 07/02/2024 0948   CREATININE 0.90 07/02/2024 0948   CALCIUM 9.2 07/02/2024 0948   PROT 7.9 07/02/2024 0948   ALBUMIN 4.6 07/02/2024 0948   AST 16 07/02/2024 0948   ALT 24 07/02/2024 0948    ALKPHOS 66 07/02/2024 0948   BILITOT 0.5 07/02/2024 0948     Assessment/Plan:    Assessment and Plan Assessment & Plan        Camie Furbish, PA-C [facility] 08/20/2024, 9:14 AM  Patient Care Team: Patient, No Pcp Per as PCP - General (General Practice)

## 2024-10-01 ENCOUNTER — Telehealth: Payer: Self-pay

## 2024-10-01 NOTE — Telephone Encounter (Signed)
 [employer] has been put at the front desk on the 2 nd floor.    Left message on machine to call back using interpreter

## 2024-10-01 NOTE — Telephone Encounter (Signed)
-----   Message from Nurse Odetta SQUIBB, RN sent at 07/12/2024  9:41 AM EDT ----- Pt needs [employer]

## 2024-10-02 NOTE — Telephone Encounter (Signed)
 The patient has been notified of this information and all questions answered.   He will pick up as able.
